# Patient Record
Sex: Male | Born: 2005 | Race: White | Hispanic: No | Marital: Single | Smoking: Never smoker
Health system: Southern US, Community
[De-identification: ages and names within clinical notes are randomized; demographics above are authoritative.]

## PROBLEM LIST (undated history)

## (undated) DIAGNOSIS — Q676 Pectus excavatum: Secondary | ICD-10-CM

## (undated) DIAGNOSIS — B019 Varicella without complication: Secondary | ICD-10-CM

## (undated) DIAGNOSIS — S62309A Unspecified fracture of unspecified metacarpal bone, initial encounter for closed fracture: Secondary | ICD-10-CM

## (undated) DIAGNOSIS — T7840XA Allergy, unspecified, initial encounter: Secondary | ICD-10-CM

## (undated) HISTORY — DX: Pectus excavatum: Q67.6

## (undated) HISTORY — DX: Unspecified fracture of unspecified metacarpal bone, initial encounter for closed fracture: S62.309A

## (undated) HISTORY — DX: Varicella without complication: B01.9

## (undated) HISTORY — DX: Allergy, unspecified, initial encounter: T78.40XA

---
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
## 2024-09-04 ENCOUNTER — Encounter: Admitting: Family Medicine
